# Patient Record
Sex: Male | Born: 2000 | Race: White | Hispanic: No | Marital: Single
Health system: Southern US, Community
[De-identification: ages and names within clinical notes are randomized; demographics above are authoritative.]

## PROBLEM LIST (undated history)

## (undated) HISTORY — PX: LEG SURGERY: SHX1003

---
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
## 2019-05-11 ENCOUNTER — Other Ambulatory Visit: Payer: Self-pay | Admitting: *Deleted

## 2019-05-11 DIAGNOSIS — Z20822 Contact with and (suspected) exposure to covid-19: Secondary | ICD-10-CM

## 2019-05-13 LAB — NOVEL CORONAVIRUS, NAA: SARS-CoV-2, NAA: NOT DETECTED

## 2019-05-16 ENCOUNTER — Telehealth: Payer: Self-pay | Admitting: General Practice

## 2019-05-16 NOTE — Telephone Encounter (Signed)
Pt called in for covid result  Advised per Not Detected result.

## 2020-01-17 ENCOUNTER — Ambulatory Visit
Admission: EM | Admit: 2020-01-17 | Discharge: 2020-01-17 | Disposition: A | Discharge status: Home / Self Care | Payer: PRIVATE HEALTH INSURANCE | Attending: Family Medicine | Admitting: Family Medicine

## 2020-01-17 ENCOUNTER — Other Ambulatory Visit: Payer: Self-pay

## 2020-01-17 DIAGNOSIS — J209 Acute bronchitis, unspecified: Secondary | ICD-10-CM

## 2020-01-17 DIAGNOSIS — Z20822 Contact with and (suspected) exposure to covid-19: Secondary | ICD-10-CM

## 2020-01-17 DIAGNOSIS — J3089 Other allergic rhinitis: Secondary | ICD-10-CM

## 2020-01-17 DIAGNOSIS — Z20828 Contact with and (suspected) exposure to other viral communicable diseases: Secondary | ICD-10-CM

## 2020-01-17 DIAGNOSIS — Z1152 Encounter for screening for COVID-19: Secondary | ICD-10-CM

## 2020-01-17 DIAGNOSIS — R059 Cough, unspecified: Secondary | ICD-10-CM

## 2020-01-17 MED ORDER — ALBUTEROL SULFATE HFA 108 (90 BASE) MCG/ACT IN AERS: 2.0000 | INHALATION_SPRAY | RESPIRATORY_TRACT | 0 refills | Status: AC | PRN

## 2020-01-17 MED ORDER — PREDNISONE 10 MG (21) PO TBPK: ORAL_TABLET | Freq: Every day | ORAL | 0 refills | Status: AC

## 2020-01-17 MED ORDER — LEVOCETIRIZINE DIHYDROCHLORIDE 5 MG PO TABS: 5.0000 mg | ORAL_TABLET | Freq: Every evening | ORAL | 0 refills | Status: AC

## 2020-01-17 NOTE — Discharge Instructions (Addendum)
I have sent in an albuterol inhaler for you to use   I have sent in prednisone for you to take as well  I have also sent in xyzal for you to take for allergies  Follow up with this office or with primary care if you develop fever, discolored phlegm, or other concerning symptoms

## 2020-01-17 NOTE — ED Provider Notes (Signed)
[facility]   [id number] 01/17/20 Arrival Time: 1611   SUBJECTIVE:  Philip Alexander is a 19 y.o. male who presents with complaint of nasal congestion, post-nasal drainage, and a persistent cough. Onset abrupt approximately 3 days ago. Overall fatigued. SOB: mild. Wheezing: mild. OTC treatment: OTC cough suppresant. Social History   Tobacco Use  Smoking Status Passive Smoke Exposure - Never Smoker  Smokeless Tobacco Current User  . Types: Chew    ROS: As per HPI.   OBJECTIVE:  Vitals:   01/17/20 1625  BP: 123/82  Pulse: 78  Resp: 17  Temp: 98.2 F (36.8 C)  TempSrc: Oral  SpO2: 97%     General appearance: alert; no distress HEENT: nasal congestion; clear runny nose; throat irritation secondary to post-nasal drainage Neck: supple without LAD Lungs: wheezing noted in bilateral lower lobes Skin: warm and dry Psychological: alert and cooperative; normal mood and affect  Results for orders placed or performed during the hospital encounter of 01/17/20  Novel Coronavirus, NAA ([facility])   Specimen: Nasopharyngeal Swab; Nasopharyngeal(NP) swabs in vial transport medium   Nasopharynge  Result Value Ref Range   SARS-CoV-2, NAA Not Detected Not Detected  SARS-COV-2, NAA 2 DAY TAT   Nasopharynge  Result Value Ref Range   SARS-CoV-2, NAA 2 DAY TAT Performed     Labs Reviewed  NOVEL CORONAVIRUS, NAA   Narrative:    Performed at:  [address] [employer] [address], [place], [place]  [id number] Lab Director: Jolene Schimke MD, Phone:  [phone]  SARS-COV-2, NAA 2 DAY TAT   Narrative:    Performed at:  [address] [place] [address] [place], [place]  [id number] Lab Director: Jolene Schimke MD, Phone:  [phone]    No results found.  Allergies  Allergen Reactions  . Codeine     hives    No past medical history on file. Social History   Socioeconomic History  . Marital status: Single    Spouse name: Not on file  . Number of children:  Not on file  . Years of education: Not on file  . Highest education level: Not on file  Occupational History  . Not on file  Tobacco Use  . Smoking status: Passive Smoke Exposure - Never Smoker  . Smokeless tobacco: Current User    Types: Chew  Substance and Sexual Activity  . Alcohol use: No  . Drug use: No  . Sexual activity: Not on file  Other Topics Concern  . Not on file  Social History Narrative  . Not on file   Social Determinants of Health   Financial Resource Strain:   . Difficulty of Paying Living Expenses:   Food Insecurity:   . Worried About Programme researcher, broadcasting/film/video in the Last Year:   . Barista in the Last Year:   Transportation Needs:   . Freight forwarder (Medical):   Marland Kitchen Lack of Transportation (Non-Medical):   Physical Activity:   . Days of Exercise per Week:   . Minutes of Exercise per Session:   Stress:   . Feeling of Stress :   Social Connections:   . Frequency of Communication with Friends and Family:   . Frequency of Social Gatherings with Friends and Family:   . Attends Religious Services:   . Active Member of Clubs or Organizations:   . Attends Banker Meetings:   Marland Kitchen Marital Status:   Intimate Partner Violence:   . Fear of Current or  Ex-Partner:   . Emotionally Abused:   Marland Kitchen Physically Abused:   . Sexually Abused:    No family history on file.   ASSESSMENT & PLAN:  1. Acute bronchitis, unspecified organism   2. Encounter for preoperative screening laboratory testing for COVID-19 virus   3. Encounter for screening for COVID-19   4. Cough   5. Allergic rhinitis due to other allergic trigger, unspecified seasonality     Meds ordered this encounter  Medications  . predniSONE (STERAPRED UNI-PAK 21 TAB) 10 MG (21) TBPK tablet    Sig: Take by mouth daily for 6 days. Take 6 tablets on day 1, 5 tablets on day 2, 4 tablets on day 3, 3 tablets on day 4, 2 tablets on day 5, 1 tablet on day 6    Dispense:  21 tablet    Refill:   0    Order Specific Question:   Supervising Provider    Answer:   Chase Picket [id number]  . albuterol (VENTOLIN HFA) 108 (90 Base) MCG/ACT inhaler    Sig: Inhale 2 puffs into the lungs every 4 (four) hours as needed for wheezing or shortness of breath.    Dispense:  18 g    Refill:  0    Order Specific Question:   Supervising Provider    Answer:   Chase Picket [id number]  . levocetirizine (XYZAL) 5 MG tablet    Sig: Take 1 tablet (5 mg total) by mouth every evening.    Dispense:  30 tablet    Refill:  0    Order Specific Question:   Supervising Provider    Answer:   Chase Picket [[id number]]    Acute Bronchitis Allergic Rhinitis Covid Screen  Prescribed Xyzal Prescribed Prednisone taper Prescribed albuterol inhaler  OTC symptom care as needed. Will plan f/u with PCP or here as needed.  Reviewed expectations re: course of current medical issues. Questions answered. Outlined signs and symptoms indicating need for more acute intervention. Patient verbalized understanding. After Visit Summary given.            Faustino Congress, NP 01/18/20 [phone]

## 2020-01-17 NOTE — ED Triage Notes (Signed)
Pt presents with c/o cough since yesterday

## 2020-01-18 LAB — SARS-COV-2, NAA 2 DAY TAT

## 2020-01-18 LAB — NOVEL CORONAVIRUS, NAA: SARS-CoV-2, NAA: NOT DETECTED

## 2020-02-22 ENCOUNTER — Emergency Department (HOSPITAL_COMMUNITY)
Admission: EM | Admit: 2020-02-22 | Discharge: 2020-02-22 | Disposition: A | Discharge status: Home / Self Care | Payer: PRIVATE HEALTH INSURANCE | Attending: Emergency Medicine | Admitting: Emergency Medicine

## 2020-02-22 ENCOUNTER — Encounter: Payer: Self-pay | Admitting: Emergency Medicine

## 2020-02-22 ENCOUNTER — Ambulatory Visit (INDEPENDENT_AMBULATORY_CARE_PROVIDER_SITE_OTHER)
Admission: EM | Admit: 2020-02-22 | Discharge: 2020-02-22 | Disposition: A | Discharge status: Home / Self Care | Payer: PRIVATE HEALTH INSURANCE | Source: Home / Self Care

## 2020-02-22 ENCOUNTER — Emergency Department (HOSPITAL_COMMUNITY): Payer: PRIVATE HEALTH INSURANCE

## 2020-02-22 DIAGNOSIS — Z7722 Contact with and (suspected) exposure to environmental tobacco smoke (acute) (chronic): Secondary | ICD-10-CM | POA: Diagnosis not present

## 2020-02-22 DIAGNOSIS — R509 Fever, unspecified: Secondary | ICD-10-CM | POA: Diagnosis present

## 2020-02-22 DIAGNOSIS — B349 Viral infection, unspecified: Secondary | ICD-10-CM | POA: Insufficient documentation

## 2020-02-22 DIAGNOSIS — R000 Tachycardia, unspecified: Secondary | ICD-10-CM | POA: Insufficient documentation

## 2020-02-22 DIAGNOSIS — Z20822 Contact with and (suspected) exposure to covid-19: Secondary | ICD-10-CM | POA: Insufficient documentation

## 2020-02-22 DIAGNOSIS — R42 Dizziness and giddiness: Secondary | ICD-10-CM | POA: Diagnosis not present

## 2020-02-22 DIAGNOSIS — R531 Weakness: Secondary | ICD-10-CM

## 2020-02-22 LAB — CBC WITH DIFFERENTIAL/PLATELET
Abs Immature Granulocytes: 0.02 10*3/uL (ref 0.00–0.07)
Basophils Absolute: 0 10*3/uL (ref 0.0–0.1)
Basophils Relative: 0 %
Eosinophils Absolute: 0 10*3/uL (ref 0.0–0.5)
Eosinophils Relative: 0 %
HCT: 43.8 % (ref 39.0–52.0)
Hemoglobin: 15.4 g/dL (ref 13.0–17.0)
Immature Granulocytes: 0 %
Lymphocytes Relative: 7 %
Lymphs Abs: 0.6 10*3/uL — ABNORMAL LOW (ref 0.7–4.0)
MCH: 32.4 pg (ref 26.0–34.0)
MCHC: 35.2 g/dL (ref 30.0–36.0)
MCV: 92 fL (ref 80.0–100.0)
Monocytes Absolute: 0.6 10*3/uL (ref 0.1–1.0)
Monocytes Relative: 7 %
Neutro Abs: 7.4 10*3/uL (ref 1.7–7.7)
Neutrophils Relative %: 86 %
Platelets: 135 10*3/uL — ABNORMAL LOW (ref 150–400)
RBC: 4.76 MIL/uL (ref 4.22–5.81)
RDW: 12.2 % (ref 11.5–15.5)
WBC: 8.7 10*3/uL (ref 4.0–10.5)
nRBC: 0 % (ref 0.0–0.2)

## 2020-02-22 LAB — COMPREHENSIVE METABOLIC PANEL
ALT: 20 U/L (ref 0–44)
AST: 19 U/L (ref 15–41)
Albumin: 4.8 g/dL (ref 3.5–5.0)
Alkaline Phosphatase: 58 U/L (ref 38–126)
Anion gap: 10 (ref 5–15)
BUN: 11 mg/dL (ref 6–20)
CO2: 25 mmol/L (ref 22–32)
Calcium: 9 mg/dL (ref 8.9–10.3)
Chloride: 101 mmol/L (ref 98–111)
Creatinine, Ser: 0.86 mg/dL (ref 0.61–1.24)
GFR calc Af Amer: >60 mL/min (ref >60)
GFR calc non Af Amer: >60 mL/min (ref >60)
Glucose, Bld: 96 mg/dL (ref 70–99)
Potassium: 3.3 mmol/L — ABNORMAL LOW (ref 3.5–5.1)
Sodium: 136 mmol/L (ref 135–145)
Total Bilirubin: 1 mg/dL (ref 0.3–1.2)
Total Protein: 7.4 g/dL (ref 6.5–8.1)

## 2020-02-22 LAB — POC SARS CORONAVIRUS 2 AG -  ED: SARS Coronavirus 2 Ag: NEGATIVE

## 2020-02-22 LAB — POCT FASTING CBG KUC MANUAL ENTRY: POCT Glucose (KUC): 103 mg/dL — AB (ref 70–99)

## 2020-02-22 LAB — GROUP A STREP BY PCR: Group A Strep by PCR: NOT DETECTED

## 2020-02-22 LAB — SARS CORONAVIRUS 2 BY RT PCR (HOSPITAL ORDER, PERFORMED IN ~~LOC~~ HOSPITAL LAB): SARS Coronavirus 2: NEGATIVE

## 2020-02-22 LAB — CBG MONITORING, ED: Glucose-Capillary: 93 mg/dL (ref 70–99)

## 2020-02-22 MED ORDER — ONDANSETRON HCL 4 MG/2ML IJ SOLN
4.0000 mg | Freq: Once | INTRAMUSCULAR | Status: AC
  Administered 2020-02-22: 4 mg via INTRAVENOUS
  Filled 2020-02-22: qty 2

## 2020-02-22 MED ORDER — SODIUM CHLORIDE 0.9 % IV BOLUS
1000.0000 mL | Freq: Once | INTRAVENOUS | Status: AC
  Administered 2020-02-22: 1000 mL via INTRAVENOUS

## 2020-02-22 MED ORDER — ACETAMINOPHEN 325 MG PO TABS
650.0000 mg | ORAL_TABLET | Freq: Once | ORAL | Status: AC
  Administered 2020-02-22: 650 mg via ORAL
  Filled 2020-02-22: qty 2

## 2020-02-22 MED ORDER — ONDANSETRON 4 MG PO TBDP
4.0000 mg | ORAL_TABLET | Freq: Three times a day (TID) | ORAL | 0 refills | Status: DC | PRN
Start: 2020-02-22 — End: 2020-03-26

## 2020-02-22 NOTE — ED Provider Notes (Signed)
[facility] EMERGENCY DEPARTMENT Provider Note   CSN: [id number] Arrival date & time: 02/22/20  1501     History Chief Complaint  Patient presents with  . Heat Exposure    Philip Alexander is a 19 y.o. male.  HPI   This patient is an otherwise healthy 19 year old male who reports that for the last 2 or 3 days he has had some upper respiratory symptoms including sore throat, coughing, subjective fevers and chills, went to the urgent care today and had a temperature of 103.  He arrives to the hospital with a complaint of ongoing dizziness and lightheadedness which actually started last night.  He has not had any nausea vomiting or diarrhea.  He has not lost sense of taste or smell.  He reports having a cough for the last month, was told it was bronchitis, no other known sick exposures.  Symptoms are persistent, gradually worsening, and not associated with any rashes to the skin.  He has not been vaccinated for Covid.  No past medical history on file.  There are no problems to display for this patient.   Past Surgical History:  Procedure Laterality Date  . LEG SURGERY Left        No family history on file.  Social History   Tobacco Use  . Smoking status: Passive Smoke Exposure - Never Smoker  . Smokeless tobacco: Current User    Types: Chew  Substance Use Topics  . Alcohol use: No  . Drug use: No    Home Medications Prior to Admission medications   Medication Sig Start Date End Date Taking? Authorizing Provider  albuterol (VENTOLIN HFA) 108 (90 Base) MCG/ACT inhaler Inhale 2 puffs into the lungs every 4 (four) hours as needed for wheezing or shortness of breath. 01/17/20  Yes Moshe Cipro, NP  dextromethorphan-guaiFENesin [facility] DM) 30-600 MG 12hr tablet Take 1 tablet by mouth 2 (two) times daily.   Yes [provider]  levocetirizine (XYZAL) 5 MG tablet Take 1 tablet (5 mg total) by mouth every evening. Patient not taking: Reported on 02/22/2020 01/17/20    Moshe Cipro, NP  ondansetron (ZOFRAN ODT) 4 MG disintegrating tablet Take 1 tablet (4 mg total) by mouth every 8 (eight) hours as needed for nausea. 02/22/20   Eber Hong, MD    Allergies    Codeine  Review of Systems   Review of Systems  All other systems reviewed and are negative.   Physical Exam Updated Vital Signs BP (!) 113/93   Pulse 98   Temp (!) 101.1 F (38.4 C) (Oral)   Resp (!) 33   SpO2 98%   Physical Exam Vitals and nursing note reviewed.  Constitutional:      General: He is not in acute distress.    Appearance: He is well-developed.  HENT:     Head: Normocephalic and atraumatic.     Mouth/Throat:     Pharynx: Posterior oropharyngeal erythema present. No oropharyngeal exudate.     Comments: Erythematous pharynx without asymmetry, exudate or changes in phonation Eyes:     General: No scleral icterus.       Right eye: No discharge.        Left eye: No discharge.     Conjunctiva/sclera: Conjunctivae normal.     Pupils: Pupils are equal, round, and reactive to light.  Neck:     Thyroid: No thyromegaly.     Vascular: No JVD.  Cardiovascular:     Rate and Rhythm: Regular rhythm. Tachycardia  present.     Heart sounds: Normal heart sounds. No murmur heard.  No friction rub. No gallop.      Comments: Mild tachycardia, normal pulses at the radial arteries Pulmonary:     Effort: Pulmonary effort is normal. No respiratory distress.     Breath sounds: Normal breath sounds. No wheezing or rales.     Comments: Speaks in full sentences without any distress, no increased work of breathing, no abnormal lung sounds Abdominal:     General: Bowel sounds are normal. There is no distension.     Palpations: Abdomen is soft. There is no mass.     Tenderness: There is no abdominal tenderness.  Musculoskeletal:        General: No tenderness. Normal range of motion.     Cervical back: Normal range of motion and neck supple.  Lymphadenopathy:     Cervical: No  cervical adenopathy.  Skin:    General: Skin is warm and dry.     Findings: No erythema or rash.  Neurological:     Mental Status: He is alert.     Coordination: Coordination normal.     Comments: Awake alert and following commands, memory is intact  Psychiatric:        Behavior: Behavior normal.     ED Results / Procedures / Treatments   Labs (all labs ordered are listed, but only abnormal results are displayed) Labs Reviewed  CBC WITH DIFFERENTIAL/PLATELET - Abnormal; Notable for the following components:      Result Value   Platelets 135 (*)    Lymphs Abs 0.6 (*)    All other components within normal limits  COMPREHENSIVE METABOLIC PANEL - Abnormal; Notable for the following components:   Potassium 3.3 (*)    All other components within normal limits  GROUP A STREP BY PCR  SARS CORONAVIRUS 2 BY RT PCR (HOSPITAL ORDER, PERFORMED IN [place] [facility])  POC SARS CORONAVIRUS 2 AG -  ED  CBG MONITORING, ED    EKG None  Radiology DG Chest Port 1 View  Result Date: 02/22/2020 CLINICAL DATA:  Cough, concern for COVID-19 EXAM: PORTABLE CHEST 1 VIEW COMPARISON:  Radiograph 10/07/2015 FINDINGS: No consolidation, features of edema, pneumothorax, or effusion. Pulmonary vascularity is normally distributed. The cardiomediastinal contours are unremarkable. No acute osseous or soft tissue abnormality. Telemetry leads overlie the chest. IMPRESSION: No acute cardiopulmonary abnormality. Electronically Signed   By: Kreg Shropshire M.D.   On: 02/22/2020 16:48    Procedures Procedures (including critical care time)  Medications Ordered in ED Medications  sodium chloride 0.9 % bolus 1,000 mL (1,000 mLs Intravenous New Bag/Given 02/22/20 1516)  ondansetron (ZOFRAN) injection 4 mg (4 mg Intravenous Given 02/22/20 1558)  acetaminophen (TYLENOL) tablet 650 mg (650 mg Oral Given 02/22/20 1557)    ED Course  I have reviewed the triage vital signs and the nursing notes.  Pertinent labs &  imaging results that were available during my care of the patient were reviewed by me and considered in my medical decision making (see chart for details).  Clinical Course as of Feb 21 1745  Fri Feb 22, 2020  1650 I have personally viewed and interpreted the chest x-ray, this is an anterior posterior x-ray showing no signs of pulmonary infiltrates pneumothorax or abnormal mediastinum.  Lungs are clear, heart borders are normal, no cardiomegaly, no subdiaphragmatic air, skin and soft tissues appear unremarkable.  Normal x-ray   [BM]    Clinical Course User Index [BM]  Eber Hong, MD   MDM Rules/Calculators/A&P                          The patient has registered a fever prehospital, he appears to be mildly ill with some type of viral process, sore throat, occasional cough, headache, lightheadedness and dizziness and feeling like he is weak in the bilateral legs all suggestive of a respiratory viral process.  He has not been vaccinated for Covid.  Will check for Covid, he will get IV fluids, antiemetics as needed, fever control as needed.  The patient has had 2 - Covid test including the PCR, has had normal lab work including a white blood cell count which is 8700 and a normal hemoglobin.  The metabolic panel shows a potassium of 3.3, normal kidney function, negative strep test.  This patient appears well, he is not tachypneic on my exam, his fever has improved and is defervesced after Tylenol, his blood pressure is normal at 113/93.  The patient was updated on his findings, lab work and treatment plan including follow-up and is in total agreement.  He will be given Zofran for home and encouraged to stay to work for 2 days and drink plenty of fluids.  He understands that he may be contagious to others.  He does not have a stiff neck and has totally normal mental status  Philip Alexander was evaluated in Emergency Department on 02/22/2020 for the symptoms described in the history of present illness.  He was evaluated in the context of the global COVID-19 pandemic, which necessitated consideration that the patient might be at risk for infection with the SARS-CoV-2 virus that causes COVID-19. Institutional protocols and algorithms that pertain to the evaluation of patients at risk for COVID-19 are in a state of rapid change based on information released by regulatory bodies including the CDC and federal and state organizations. These policies and algorithms were followed during the patient's care in the ED.   Final Clinical Impression(s) / ED Diagnoses Final diagnoses:  Viral syndrome    Rx / DC Orders ED Discharge Orders         Ordered    ondansetron (ZOFRAN ODT) 4 MG disintegrating tablet  Every 8 hours PRN     Discontinue  Reprint     02/22/20 1744           Eber Hong, MD 02/22/20 1746

## 2020-02-22 NOTE — ED Triage Notes (Signed)
Pt came from [facility]. He was working outside today and began feeling bad this morning. Once he was working outside today, he felt worse. Tachycardic with EMS up to 110. Other VSS. 20 R AC with of fluids in.

## 2020-02-22 NOTE — Discharge Instructions (Addendum)
Recommending further evaluation and management in the ED for possible heat exhaustion vs. stroke.  Fever (103.1) and tachycardic (118) in office.  Blood pressure and SpO2 stable.  Water given in office.  IV and fluids started.  Cold compress applied to forehead.  Ice pack to neck.  EMS called and transported to [facility] ED in stable condition.

## 2020-02-22 NOTE — ED Triage Notes (Addendum)
Pt woke up this morning feeling weak, has been working outside with long sleeves on all day.  Pt states he has been dizzy, seeing spots, having cold chills.  Also reports cough and sore throat that has been going on a while.  No covid exposures that he is aware of.

## 2020-02-22 NOTE — ED Notes (Signed)
Patient is being discharged from the Urgent Care and sent to the Emergency Department via ambulance . Per B. Wurst, patient is in need of higher level of care due to tachycardia/ fever. Patient is aware and verbalizes understanding of plan of care.  Vitals:   02/22/20 1437  BP: (!) 129/61  Pulse: (!) 112  Resp: 19  Temp: (!) 103.1 F (39.5 C)  SpO2: 96%

## 2020-02-22 NOTE — ED Provider Notes (Addendum)
  [facility]ARE [facility]ENTER   [id number] 02/22/20 Arrival Time: 1418  Abbreviated note: KG:[id number]  SUBJECTIVE:  Philip Alexander is a 19 y.o. male who presents with complaint of dizziness, feeling weak, and seeing "spots in his vision" that began this morning while working out side.  Also mentions runny nose, congestion, sore throat, and cough that has been ongoing.  Deneis alleviating factors.  Symptoms made worse with walking.  Denies chest pain, SOB, abdominal pain, nausea, vomiting.    ROS: As per HPI.  All other pertinent ROS negative.     OBJECTIVE:  Vitals:   02/22/20 1436 02/22/20 1437  BP:  (!) 129/61  Pulse:  (!) 112  Resp:  19  Temp:  (!) 103.1 F (39.5 C)  TempSrc:  Oral  SpO2:  96%  Weight: 158 lb 11.7 oz (72 kg)   Height: 5\' 10"  (1.778 m)     General appearance: alert; appears uncomfortable, but nontoxic Eyes: PERRLA; EOMI; conjunctiva normal HENT: normocephalic; atraumatic; nares patent; oropharynx clear Neck: supple with FROM Lungs: clear to auscultation bilaterally Heart: regular rate and rhythm Skin: warm and dry Neurologic: normal gait; CN 2-12 grossly intact; no facial droop, slurred speech, moves limbs without obvious deficit Psychological: alert and cooperative; mildly anxious mood and affect  LABS:  Results for orders placed or performed during the hospital encounter of 02/22/20 (from the past 24 hour(s))  POCT CBG (manual entry)     Status: Abnormal   Collection Time: 02/22/20  2:42 PM  Result Value Ref Range   POCT Glucose (KUC) 103 (A) 70 - 99 mg/dL     ASSESSMENT & PLAN:  1. Dizzy   2. Weakness   3. Fever, unspecified   4. Tachycardia, unspecified     Meds ordered this encounter  Medications  . sodium chloride 0.9 % bolus 1,000 mL    Recommending further evaluation and management in the ED for possible heat exhaustion vs. stroke.  Fever (103.1) and tachycardic (118) in office.  Blood pressure and SpO2 stable.  Water given in office.  IV  and fluids started.  Cold compress applied to forehead.  Ice pack to neck.  EMS called and transported to [facility] ED in stable condition.      [facility] PA-C 02/22/20 1608    02/24/20 [place], PA-C 02/22/20 1610

## 2020-02-22 NOTE — Discharge Instructions (Signed)
There is no signs of acute abnormalities on your test, you have tested negative for Covid twice, strep test was negative, you probably do have a viral infection which may give you coughing shortness of breath sore throat or stuffy nose and body aches.  This is typical for viral infections.  Please stay home, drink plenty of clear liquids, if you are nauseated take Zofran every 8 hours as needed.  Please seek medical exam in the emergency department if you should develop severe or worsening symptoms

## 2020-02-25 ENCOUNTER — Encounter: Payer: Self-pay | Admitting: Emergency Medicine

## 2020-02-25 ENCOUNTER — Other Ambulatory Visit: Payer: Self-pay

## 2020-02-25 ENCOUNTER — Ambulatory Visit
Admission: EM | Admit: 2020-02-25 | Discharge: 2020-02-25 | Disposition: A | Discharge status: Home / Self Care | Payer: PRIVATE HEALTH INSURANCE | Attending: Family Medicine | Admitting: Family Medicine

## 2020-02-25 DIAGNOSIS — B341 Enterovirus infection, unspecified: Secondary | ICD-10-CM

## 2020-02-25 DIAGNOSIS — R21 Rash and other nonspecific skin eruption: Secondary | ICD-10-CM

## 2020-02-25 MED ORDER — TRIAMCINOLONE ACETONIDE 0.1 % EX CREA: 1.0000 "application " | TOPICAL_CREAM | Freq: Two times a day (BID) | CUTANEOUS | 0 refills | Status: AC

## 2020-02-25 NOTE — Discharge Instructions (Addendum)
You have hand, foot and mouth disease  This is a virus that will resolve within 2 weeks at the most  This is contagious  Follow up with this office or with primary care if you are not feeling better within the week

## 2020-02-25 NOTE — ED Triage Notes (Signed)
Red itchy rash on hands and feet since yesterday.  Pt has been in contact with 2 nephews that have recently been dx with hand foot and mouth disease.

## 2020-02-25 NOTE — ED Provider Notes (Addendum)
[facility]ARE [facility]ENTER   [id number] 02/25/20 Arrival Time: 1030  CC: RASH  SUBJECTIVE:  Philip Alexander is a 19 y.o. male who presents with a skin complaint that began yesterday. Was seen in this office with dehydration last week. Reports that his nieces and nephews have hand, foot, and mouth. Reports that the rash appears to be almost like blisters and is itchy. Located to palms of hands and soles of feet. Denies medications change or starting a new medication recently. Has not taken OTC medications for this. Denies fever, chills, nausea, vomiting, erythema, swelling, discharge, oral lesions, SOB, chest pain, abdominal pain, changes in bowel or bladder function.    ROS: As per HPI.  All other pertinent ROS negative.     History reviewed. No pertinent past medical history. Past Surgical History:  Procedure Laterality Date   LEG SURGERY Left    Allergies  Allergen Reactions   Codeine Hives   No current facility-administered medications on file prior to encounter.   Current Outpatient Medications on File Prior to Encounter  Medication Sig Dispense Refill   albuterol (VENTOLIN HFA) 108 (90 Base) MCG/ACT inhaler Inhale 2 puffs into the lungs every 4 (four) hours as needed for wheezing or shortness of breath. 18 g 0   dextromethorphan-guaiFENesin (MUCINEX DM) 30-600 MG 12hr tablet Take 1 tablet by mouth 2 (two) times daily.     levocetirizine (XYZAL) 5 MG tablet Take 1 tablet (5 mg total) by mouth every evening. (Patient not taking: Reported on 02/22/2020) 30 tablet 0   ondansetron (ZOFRAN ODT) 4 MG disintegrating tablet Take 1 tablet (4 mg total) by mouth every 8 (eight) hours as needed for nausea. 10 tablet 0   Social History   Socioeconomic History   Marital status: Single    Spouse name: Not on file   Number of children: Not on file   Years of education: Not on file   Highest education level: Not on file  Occupational History   Not on file  Tobacco Use   Smoking  status: Passive Smoke Exposure - Never Smoker   Smokeless tobacco: Current User    Types: Chew  Substance and Sexual Activity   Alcohol use: No   Drug use: No   Sexual activity: Not on file  Other Topics Concern   Not on file  Social History Narrative   Not on file   Social Determinants of Health   Financial Resource Strain:    Difficulty of Paying Living Expenses:   Food Insecurity:    Worried About Running Out of Food in the Last Year:    Barista in the Last Year:   Transportation Needs:    Freight forwarder (Medical):    Lack of Transportation (Non-Medical):   Physical Activity:    Days of Exercise per Week:    Minutes of Exercise per Session:   Stress:    Feeling of Stress :   Social Connections:    Frequency of Communication with Friends and Family:    Frequency of Social Gatherings with Friends and Family:    Attends Religious Services:    Active Member of Clubs or Organizations:    Attends Engineer, structural:    Marital Status:   Intimate Partner Violence:    Fear of Current or Ex-Partner:    Emotionally Abused:    Physically Abused:    Sexually Abused:    No family history on file.  OBJECTIVE: Vitals:   02/25/20 1053  BP: (!) 101/60  Pulse: 79  Resp: 17  Temp: 98.4 F (36.9 C)  TempSrc: Oral  SpO2: 97%    General appearance: alert; no distress Head: NCAT Lungs: clear to auscultation bilaterally Heart: regular rate and rhythm.  Radial pulse 2+ bilaterally Extremities: no edema Skin: warm and dry; erythematous, vesicular rash to palms of hands and soles of feet Psychological: alert and cooperative; normal mood and affect  ASSESSMENT & PLAN:  1. Rash and nonspecific skin eruption   2. Coxsackieviruses     Meds ordered this encounter  Medications   triamcinolone cream (KENALOG) 0.1 %    Sig: Apply 1 application topically 2 (two) times daily.    Dispense:  30 g    Refill:  0    Order Specific  Question:   Supervising Provider    Answer:   Merrilee Jansky [[id number]]    Rash consistent with coxsackie's disease Triamcinolone 0.1% (corticosteroid - itch/ inflammation relief) Avoid hot showers/ baths Moisturize skin daily  Follow up with PCP if symptoms persists Return or go to the ER if you have any new or worsening symptoms such as fever, chills, nausea, vomiting, redness, swelling, discharge, if symptoms do not improve with medications  Reviewed expectations re: course of current medical issues. Questions answered. Outlined signs and symptoms indicating need for more acute intervention. Patient verbalized understanding. After Visit Summary given.   Moshe Cipro, NP 02/29/20 0623    Moshe Cipro, NP 02/29/20 [phone]

## 2020-03-26 ENCOUNTER — Ambulatory Visit
Admission: EM | Admit: 2020-03-26 | Discharge: 2020-03-26 | Disposition: A | Discharge status: Home / Self Care | Payer: PRIVATE HEALTH INSURANCE | Attending: Emergency Medicine | Admitting: Emergency Medicine

## 2020-03-26 ENCOUNTER — Other Ambulatory Visit: Payer: Self-pay

## 2020-03-26 DIAGNOSIS — Z20822 Contact with and (suspected) exposure to covid-19: Secondary | ICD-10-CM

## 2020-03-26 DIAGNOSIS — J069 Acute upper respiratory infection, unspecified: Secondary | ICD-10-CM

## 2020-03-26 MED ORDER — BENZONATATE 100 MG PO CAPS: 100.0000 mg | ORAL_CAPSULE | Freq: Three times a day (TID) | ORAL | 0 refills | Status: AC

## 2020-03-26 NOTE — Discharge Instructions (Signed)

## 2020-03-26 NOTE — ED Provider Notes (Signed)
[facility] CARE CENTER   [id number] 03/26/20 Arrival Time: 1712   CC: COVID symptoms  SUBJECTIVE: History from: patient.  Philip Alexander is a 19 y.o. male who presents with headache, nasal congestion, and mild productive cough x 1 day.  Sister with similar symptoms.  Has tried [facility] powder with relief.  Denies aggravating factors.  Denies previous COVID infection in the past.   Denies fever, chills, sore throat, SOB, wheezing, chest pain, nausea, changes in bowel or bladder habits.     ROS: As per HPI.  All other pertinent ROS negative.     History reviewed. No pertinent past medical history. Past Surgical History:  Procedure Laterality Date  . LEG SURGERY Left    Allergies  Allergen Reactions  . Codeine Hives   No current facility-administered medications on file prior to encounter.   Current Outpatient Medications on File Prior to Encounter  Medication Sig Dispense Refill  . albuterol (VENTOLIN HFA) 108 (90 Base) MCG/ACT inhaler Inhale 2 puffs into the lungs every 4 (four) hours as needed for wheezing or shortness of breath. 18 g 0  . dextromethorphan-guaiFENesin (MUCINEX DM) 30-600 MG 12hr tablet Take 1 tablet by mouth 2 (two) times daily.    Marland Kitchen levocetirizine (XYZAL) 5 MG tablet Take 1 tablet (5 mg total) by mouth every evening. (Patient not taking: Reported on 02/22/2020) 30 tablet 0  . triamcinolone cream (KENALOG) 0.1 % Apply 1 application topically 2 (two) times daily. 30 g 0   Social History   Socioeconomic History  . Marital status: Single    Spouse name: Not on file  . Number of children: Not on file  . Years of education: Not on file  . Highest education level: Not on file  Occupational History  . Not on file  Tobacco Use  . Smoking status: Passive Smoke Exposure - Never Smoker  . Smokeless tobacco: Current User    Types: Chew  Substance and Sexual Activity  . Alcohol use: No  . Drug use: No  . Sexual activity: Not on file  Other Topics Concern  . Not on file   Social History Narrative  . Not on file   Social Determinants of Health   Financial Resource Strain:   . Difficulty of Paying Living Expenses: Not on file  Food Insecurity:   . Worried About Programme researcher, broadcasting/film/video in the Last Year: Not on file  . Ran Out of Food in the Last Year: Not on file  Transportation Needs:   . Lack of Transportation (Medical): Not on file  . Lack of Transportation (Non-Medical): Not on file  Physical Activity:   . Days of Exercise per Week: Not on file  . Minutes of Exercise per Session: Not on file  Stress:   . Feeling of Stress : Not on file  Social Connections:   . Frequency of Communication with Friends and Family: Not on file  . Frequency of Social Gatherings with Friends and Family: Not on file  . Attends Religious Services: Not on file  . Active Member of Clubs or Organizations: Not on file  . Attends Banker Meetings: Not on file  . Marital Status: Not on file  Intimate Partner Violence:   . Fear of Current or Ex-Partner: Not on file  . Emotionally Abused: Not on file  . Physically Abused: Not on file  . Sexually Abused: Not on file   History reviewed. No pertinent family history.  OBJECTIVE:  Vitals:   03/26/20 1729  BP: 110/60  Pulse: 85  Resp: 18  Temp: 98.3 F (36.8 C)  SpO2: 98%     General appearance: alert; well-appearing, nontoxic; speaking in full sentences and tolerating own secretions HEENT: NCAT; Ears: EACs clear, TMs pearly gray; Eyes: PERRL.  EOM grossly intact.Nose: nares patent without rhinorrhea, Throat: oropharynx clear, tonsils non erythematous or enlarged, uvula midline  Neck: supple without LAD Lungs: unlabored respirations, symmetrical air entry; cough: mild; no respiratory distress; CTAB Heart: regular rate and rhythm.  Skin: warm and dry Psychological: alert and cooperative; normal mood and affect   ASSESSMENT & PLAN:  1. Viral URI with cough   2. Suspected COVID-19 virus infection     Meds  ordered this encounter  Medications  . benzonatate (TESSALON) 100 MG capsule    Sig: Take 1 capsule (100 mg total) by mouth every 8 (eight) hours.    Dispense:  21 capsule    Refill:  0    Order Specific Question:   Supervising Provider    Answer:   Eustace Moore [[id number]]   COVID testing ordered.  It will take between 2-5 days for test results.  Someone will contact you regarding abnormal results.    In the meantime: You should remain isolated in your home for 10 days from symptom onset AND greater than 72 hours after symptoms resolution (absence of fever without the use of fever-reducing medication and improvement in respiratory symptoms), whichever is longer Get plenty of rest and push fluids Tessalon Perles prescribed for cough Use OTC zyrtec for nasal congestion, runny nose, and/or sore throat Use OTC flonase for nasal congestion and runny nose Use medications daily for symptom relief Use OTC medications like ibuprofen or tylenol as needed fever or pain Call or go to the ED if you have any new or worsening symptoms such as fever, worsening cough, shortness of breath, chest tightness, chest pain, turning blue, changes in mental status, etc...   Reviewed expectations re: course of current medical issues. Questions answered. Outlined signs and symptoms indicating need for more acute intervention. Patient verbalized understanding. After Visit Summary given.         Rennis Harding, PA-C 03/26/20 1730

## 2020-03-26 NOTE — ED Triage Notes (Signed)
Pt presents with c/o  cough and nasal congestion.  

## 2020-03-28 LAB — SARS-COV-2, NAA 2 DAY TAT

## 2020-03-28 LAB — NOVEL CORONAVIRUS, NAA: SARS-CoV-2, NAA: NOT DETECTED

## 2020-08-05 ENCOUNTER — Ambulatory Visit (INDEPENDENT_AMBULATORY_CARE_PROVIDER_SITE_OTHER): Payer: PRIVATE HEALTH INSURANCE

## 2020-08-05 ENCOUNTER — Ambulatory Visit
Admission: EM | Admit: 2020-08-05 | Discharge: 2020-08-05 | Disposition: A | Discharge status: Home / Self Care | Payer: PRIVATE HEALTH INSURANCE | Attending: Family Medicine | Admitting: Family Medicine

## 2020-08-05 ENCOUNTER — Other Ambulatory Visit: Payer: Self-pay

## 2020-08-05 ENCOUNTER — Encounter: Payer: Self-pay | Admitting: Emergency Medicine

## 2020-08-05 DIAGNOSIS — M542 Cervicalgia: Secondary | ICD-10-CM

## 2020-08-05 DIAGNOSIS — M79631 Pain in right forearm: Secondary | ICD-10-CM | POA: Diagnosis not present

## 2020-08-05 DIAGNOSIS — M25511 Pain in right shoulder: Secondary | ICD-10-CM

## 2020-08-05 DIAGNOSIS — S161XXA Strain of muscle, fascia and tendon at neck level, initial encounter: Secondary | ICD-10-CM

## 2020-08-05 MED ORDER — CYCLOBENZAPRINE HCL 10 MG PO TABS: 10.0000 mg | ORAL_TABLET | Freq: Two times a day (BID) | ORAL | 0 refills | Status: AC | PRN

## 2020-08-05 MED ORDER — IBUPROFEN 600 MG PO TABS: 600.0000 mg | ORAL_TABLET | Freq: Four times a day (QID) | ORAL | 0 refills | Status: AC | PRN

## 2020-08-05 NOTE — Discharge Instructions (Signed)
X-ray was negative today for any fractures or dislocations.  I have sent in ibuprofen 800 mg for you to take 1 tablet every 8 hours as needed for pain and inflammation.  I have also sent in cyclobenzaprine for you to take 3 times a day as needed for muscle spasms.  This medication may make you sleepy.  Do not drive or operate heavy machinery with this medication.  Follow up with this office or with primary care if symptoms are persisting.  Follow up in the ER for high fever, trouble swallowing, trouble breathing, other concerning symptoms.
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
## 2020-08-08 ENCOUNTER — Ambulatory Visit
Admission: EM | Admit: 2020-08-08 | Discharge: 2020-08-08 | Disposition: A | Discharge status: Home / Self Care | Payer: PRIVATE HEALTH INSURANCE

## 2020-11-16 ENCOUNTER — Emergency Department (HOSPITAL_COMMUNITY)
Admission: EM | Admit: 2020-11-16 | Discharge: 2020-11-16 | Disposition: A | Discharge status: Home / Self Care | Payer: Self-pay | Attending: Emergency Medicine | Admitting: Emergency Medicine

## 2020-11-16 ENCOUNTER — Other Ambulatory Visit: Payer: Self-pay

## 2020-11-16 ENCOUNTER — Encounter (HOSPITAL_COMMUNITY): Payer: Self-pay | Admitting: *Deleted

## 2020-11-16 DIAGNOSIS — Z7722 Contact with and (suspected) exposure to environmental tobacco smoke (acute) (chronic): Secondary | ICD-10-CM | POA: Insufficient documentation

## 2020-11-16 DIAGNOSIS — X58XXXA Exposure to other specified factors, initial encounter: Secondary | ICD-10-CM | POA: Insufficient documentation

## 2020-11-16 DIAGNOSIS — S025XXA Fracture of tooth (traumatic), initial encounter for closed fracture: Secondary | ICD-10-CM | POA: Insufficient documentation

## 2020-11-16 DIAGNOSIS — K0889 Other specified disorders of teeth and supporting structures: Secondary | ICD-10-CM

## 2020-11-16 MED ORDER — AMOXICILLIN 500 MG PO CAPS
500.0000 mg | ORAL_CAPSULE | Freq: Three times a day (TID) | ORAL | 0 refills | Status: AC
Start: 2020-11-16 — End: ?

## 2020-11-16 NOTE — ED Triage Notes (Signed)
Pt with lower left dental pain x 2 months.

## 2020-11-16 NOTE — Discharge Instructions (Signed)
Please pick up antibiotics and take as prescribed to cover for an infection.  It is recommended that you take 800 mg Ibuprofen every 8 hours as needed for pain as well as 1,000 mg Tylenol (2 OTC double strength tablets) every 8 hours as needed for pain. You can alternate these medications every 4 hours to stay on top of the pain.   You will need to follow up with your dentist for further evaluation and ultimate extraction of this tooth. Attached is an additional Writer guide for dentists in the area.   Return to the ED for any worsening symptoms

## 2020-11-16 NOTE — ED Provider Notes (Signed)
[facility] EMERGENCY DEPARTMENT Provider Note   CSN: [id number] Arrival date & time: 11/16/20  1144     History Chief Complaint  Patient presents with  . Dental Pain    Philip Alexander is a 20 y.o. male who presents to the ED today with complaint of gradual onset, constant, achy, gradually worsening, left lower dental pain x 2 months, worsening over the past week. Pt reports that 2 months ago a portion of his tooth broke off and he has been having pain since then however last week he began having worsening pain. He complains of facial swelling and feeling like there is "infection" in his tooth. He denies any foul taste in his mouth or drainage from the tooth. He felt feverish last night but did not take his temperature. Pt does have a dentist however has not had the financial means to go and see him over the past 2 months. He has been taking 800 mg Ibuprofen with mild relief of the pain. No other complaints at this time.   The history is provided by the patient and medical records.       History reviewed. No pertinent past medical history.  There are no problems to display for this patient.   Past Surgical History:  Procedure Laterality Date  . LEG SURGERY Left        History reviewed. No pertinent family history.  Social History   Tobacco Use  . Smoking status: Passive Smoke Exposure - Never Smoker  . Smokeless tobacco: Former Engineer, water Use Topics  . Alcohol use: No  . Drug use: No    Home Medications Prior to Admission medications   Medication Sig Start Date End Date Taking? Authorizing Provider  amoxicillin (AMOXIL) 500 MG capsule Take 1 capsule (500 mg total) by mouth 3 (three) times daily. 11/16/20  Yes Malakye Nolden, PA-C  albuterol (VENTOLIN HFA) 108 (90 Base) MCG/ACT inhaler Inhale 2 puffs into the lungs every 4 (four) hours as needed for wheezing or shortness of breath. 01/17/20   Moshe Cipro, NP  benzonatate (TESSALON) 100 MG capsule Take 1  capsule (100 mg total) by mouth every 8 (eight) hours. 03/26/20   Wurst, [place], PA-C  cyclobenzaprine (FLEXERIL) 10 MG tablet Take 1 tablet (10 mg total) by mouth 2 (two) times daily as needed for muscle spasms. 08/05/20   Moshe Cipro, NP  dextromethorphan-guaiFENesin [facility] DM) 30-600 MG 12hr tablet Take 1 tablet by mouth 2 (two) times daily.    [provider]  ibuprofen (ADVIL) 600 MG tablet Take 1 tablet (600 mg total) by mouth every 6 (six) hours as needed. 08/05/20   Moshe Cipro, NP  levocetirizine (XYZAL) 5 MG tablet Take 1 tablet (5 mg total) by mouth every evening. Patient not taking: No sig reported 01/17/20   Moshe Cipro, NP  triamcinolone cream (KENALOG) 0.1 % Apply 1 application topically 2 (two) times daily. 02/25/20   Moshe Cipro, NP    Allergies    Codeine  Review of Systems   Review of Systems  Constitutional: Positive for fever (subjective).  HENT: Positive for dental problem and facial swelling. Negative for ear pain and trouble swallowing.   All other systems reviewed and are negative.   Physical Exam Updated Vital Signs BP 114/61 (BP Location: Left Arm)   Pulse 78   Temp 98.6 F (37 C) (Oral)   Resp 16   Ht 5\' 8"  (1.727 m)   Wt 76.2 kg   SpO2 100%  BMI 25.54 kg/m   Physical Exam Vitals and nursing note reviewed.  Constitutional:      Appearance: He is not ill-appearing.  HENT:     Head: Normocephalic and atraumatic.     Mouth/Throat:     Comments: Nose clear.  L upper tooth #17 decayed and fractured to the gumline with caries with TTP, with minimal surrounding gingival swelling and erythema, no definite abscess, no evidence of ludwig's.  Oropharynx clear and moist, without uvular swelling or deviation, no trismus or drooling, no tonsillar swelling or erythema, no exudates.   Eyes:     Conjunctiva/sclera: Conjunctivae normal.  Cardiovascular:     Rate and Rhythm: Normal rate and regular rhythm.     Pulses:  Normal pulses.     Heart sounds: No murmur heard.   Pulmonary:     Effort: Pulmonary effort is normal.     Breath sounds: Normal breath sounds. No wheezing, rhonchi or rales.  Skin:    General: Skin is warm and dry.     Coloration: Skin is not jaundiced.  Neurological:     Mental Status: He is alert.     ED Results / Procedures / Treatments   Labs (all labs ordered are listed, but only abnormal results are displayed) Labs Reviewed - No data to display  EKG None  Radiology No results found.  Procedures Procedures   Medications Ordered in ED Medications - No data to display  ED Course  I have reviewed the triage vital signs and the nursing notes.  Pertinent labs & imaging results that were available during my care of the patient were reviewed by me and considered in my medical decision making (see chart for details).    MDM Rules/Calculators/A&P                          20 year old male who presents to the ED today with complaints of left lower dental pain for the past 2 months, worsening over the past week.  History of fracture to this tooth.  Does have a dentist however has not had financial means to follow-up with him.  On arrival to the ED vitals are stable.  Patient is afebrile, nontachycardic and nontachypneic and appears to be in no acute distress.  He does complain of facial swelling however no obvious facial swelling appreciated on my exam.  No concern for Ludwig's angina at this time.  No trismus.  Patient does have fractured tooth #17 to the gumline with dental caries and gingival erythema.  There is no dental abscess to be drained at this time.  I do feel patient requires antibiotics and close follow-up with dentistry.  He is recommended to follow-up this week as he will need a tooth extracted.  Have instructed to continue 800 ibuprofen every 8 hours as well as an additional 1000 mg Tylenol every 8 hours as needed for pain.  Patient is in agreement with plan at this  time.  Was going to initially give him a one-time dose of a narcotic pain medication in the ED however he states he is allergic to codeine complains of anaphylaxis.  He states his pain is minimal at this time however he wanted to come to the ED t  be evaluated prior to it becoming severe again as it had last night.  Will discharge patient home at this time with antibiotics.  Instructed to start taking ibuprofen when he gets home and to alternate  ibuprofen and Tylenol as needed for pain.  Patient in agreement with plan at this time and stable for discharge.  This note was prepared using Dragon voice recognition software and may include unintentional dictation errors due to the inherent limitations of voice recognition software.  Final Clinical Impression(s) / ED Diagnoses Final diagnoses:  Pain, dental  Closed fracture of tooth, initial encounter    Rx / DC Orders ED Discharge Orders         Ordered    amoxicillin (AMOXIL) 500 MG capsule  3 times daily        11/16/20 1230           Discharge Instructions     Please pick up antibiotics and take as prescribed to cover for an infection.  It is recommended that you take 800 mg Ibuprofen every 8 hours as needed for pain as well as 1,000 mg Tylenol (2 OTC double strength tablets) every 8 hours as needed for pain. You can alternate these medications every 4 hours to stay on top of the pain.   You will need to follow up with your dentist for further evaluation and ultimate extraction of this tooth. Attached is an additional Writer guide for dentists in the area.   Return to the ED for any worsening symptoms       Tanda Rockers, PA-C 11/16/20 1231    Derwood Kaplan, MD 11/16/20 1424

## 2022-05-19 ENCOUNTER — Other Ambulatory Visit: Payer: Self-pay

## 2022-05-19 ENCOUNTER — Emergency Department: Payer: Self-pay

## 2022-05-19 DIAGNOSIS — R059 Cough, unspecified: Secondary | ICD-10-CM | POA: Insufficient documentation

## 2022-05-19 DIAGNOSIS — R197 Diarrhea, unspecified: Secondary | ICD-10-CM | POA: Insufficient documentation

## 2022-05-19 DIAGNOSIS — Z20822 Contact with and (suspected) exposure to covid-19: Secondary | ICD-10-CM | POA: Insufficient documentation

## 2022-05-19 DIAGNOSIS — Z5321 Procedure and treatment not carried out due to patient leaving prior to being seen by health care provider: Secondary | ICD-10-CM | POA: Insufficient documentation

## 2022-05-19 DIAGNOSIS — R112 Nausea with vomiting, unspecified: Secondary | ICD-10-CM | POA: Insufficient documentation

## 2022-05-19 DIAGNOSIS — R509 Fever, unspecified: Secondary | ICD-10-CM | POA: Insufficient documentation

## 2022-05-19 LAB — CBC WITH DIFFERENTIAL/PLATELET
Abs Immature Granulocytes: 0.02 10*3/uL (ref 0.00–0.07)
Basophils Absolute: 0 10*3/uL (ref 0.0–0.1)
Basophils Relative: 0 %
Eosinophils Absolute: 0 10*3/uL (ref 0.0–0.5)
Eosinophils Relative: 0 %
HCT: 49.1 % (ref 39.0–52.0)
Hemoglobin: 17.4 g/dL — ABNORMAL HIGH (ref 13.0–17.0)
Immature Granulocytes: 0 %
Lymphocytes Relative: 15 %
Lymphs Abs: 1.1 10*3/uL (ref 0.7–4.0)
MCH: 33.3 pg (ref 26.0–34.0)
MCHC: 35.4 g/dL (ref 30.0–36.0)
MCV: 94.1 fL (ref 80.0–100.0)
Monocytes Absolute: 0.6 10*3/uL (ref 0.1–1.0)
Monocytes Relative: 9 %
Neutro Abs: 5.5 10*3/uL (ref 1.7–7.7)
Neutrophils Relative %: 76 %
Platelets: 206 10*3/uL (ref 150–400)
RBC: 5.22 MIL/uL (ref 4.22–5.81)
RDW: 13 % (ref 11.5–15.5)
WBC: 7.2 10*3/uL (ref 4.0–10.5)
nRBC: 0 % (ref 0.0–0.2)

## 2022-05-19 LAB — URINALYSIS, ROUTINE W REFLEX MICROSCOPIC
Bacteria, UA: NONE SEEN
Bilirubin Urine: NEGATIVE
Glucose, UA: NEGATIVE mg/dL
Ketones, ur: NEGATIVE mg/dL
Leukocytes,Ua: NEGATIVE
Nitrite: NEGATIVE
Protein, ur: NEGATIVE mg/dL
Specific Gravity, Urine: 1.001 — ABNORMAL LOW (ref 1.005–1.030)
Squamous Epithelial / HPF: NONE SEEN (ref 0–5)
pH: 7 (ref 5.0–8.0)

## 2022-05-19 LAB — COMPREHENSIVE METABOLIC PANEL
ALT: 27 U/L (ref 0–44)
AST: 26 U/L (ref 15–41)
Albumin: 4.8 g/dL (ref 3.5–5.0)
Alkaline Phosphatase: 69 U/L (ref 38–126)
Anion gap: 11 (ref 5–15)
BUN: <5 mg/dL — ABNORMAL LOW (ref 6–20)
CO2: 24 mmol/L (ref 22–32)
Calcium: 9.5 mg/dL (ref 8.9–10.3)
Chloride: 102 mmol/L (ref 98–111)
Creatinine, Ser: 0.68 mg/dL (ref 0.61–1.24)
GFR, Estimated: >60 mL/min (ref >60)
Glucose, Bld: 143 mg/dL — ABNORMAL HIGH (ref 70–99)
Potassium: 2.8 mmol/L — ABNORMAL LOW (ref 3.5–5.1)
Sodium: 137 mmol/L (ref 135–145)
Total Bilirubin: 0.7 mg/dL (ref 0.3–1.2)
Total Protein: 8.3 g/dL — ABNORMAL HIGH (ref 6.5–8.1)

## 2022-05-19 LAB — RESP PANEL BY RT-PCR (FLU A&B, COVID) ARPGX2
Influenza A by PCR: NEGATIVE
Influenza B by PCR: NEGATIVE
SARS Coronavirus 2 by RT PCR: NEGATIVE

## 2022-05-19 LAB — TROPONIN I (HIGH SENSITIVITY): Troponin I (High Sensitivity): 4 ng/L (ref <18)

## 2022-05-19 LAB — LIPASE, BLOOD: Lipase: 28 U/L (ref 11–51)

## 2022-05-19 MED ORDER — POTASSIUM CHLORIDE 20 MEQ PO PACK: 40.0000 meq | PACK | Freq: Once | ORAL | Status: DC

## 2022-05-19 MED ORDER — ONDANSETRON HCL 4 MG/2ML IJ SOLN: 4.0000 mg | Freq: Once | INTRAMUSCULAR | Status: DC

## 2022-05-19 MED ORDER — SODIUM CHLORIDE 0.9 % IV BOLUS (SEPSIS): 1000.0000 mL | Freq: Once | INTRAVENOUS | Status: DC

## 2022-05-19 NOTE — ED Triage Notes (Signed)
Pt arrives with c/o n/v/d that started Sunday. Pt denies CP or SOB. Pt endorses fevers and cough also.

## 2022-05-20 ENCOUNTER — Emergency Department
Admission: EM | Admit: 2022-05-20 | Discharge: 2022-05-20 | Disposition: A | Discharge status: Home / Self Care | Payer: Self-pay | Attending: Emergency Medicine | Admitting: Emergency Medicine

## 2022-05-20 NOTE — ED Notes (Signed)
No answer when called several times from lobby 

## 2022-05-20 NOTE — ED Notes (Signed)
No answer when called several times from lobby; no answer when phone # listed in chart called 

## 2022-12-04 ENCOUNTER — Emergency Department (HOSPITAL_COMMUNITY): Payer: Medicaid Other

## 2022-12-04 ENCOUNTER — Other Ambulatory Visit: Payer: Self-pay

## 2022-12-04 ENCOUNTER — Emergency Department (HOSPITAL_COMMUNITY)
Admission: EM | Admit: 2022-12-04 | Discharge: 2022-12-04 | Disposition: A | Discharge status: Home / Self Care | Payer: Medicaid Other | Attending: Emergency Medicine | Admitting: Emergency Medicine

## 2022-12-04 ENCOUNTER — Encounter (HOSPITAL_COMMUNITY): Payer: Self-pay

## 2022-12-04 DIAGNOSIS — S0285XA Fracture of orbit, unspecified, initial encounter for closed fracture: Secondary | ICD-10-CM

## 2022-12-04 DIAGNOSIS — S02831A Fracture of medial orbital wall, right side, initial encounter for closed fracture: Secondary | ICD-10-CM | POA: Insufficient documentation

## 2022-12-04 DIAGNOSIS — S01111A Laceration without foreign body of right eyelid and periocular area, initial encounter: Secondary | ICD-10-CM | POA: Diagnosis not present

## 2022-12-04 DIAGNOSIS — S0181XA Laceration without foreign body of other part of head, initial encounter: Secondary | ICD-10-CM | POA: Diagnosis present

## 2022-12-04 MED ORDER — LIDOCAINE-EPINEPHRINE-TETRACAINE (LET) TOPICAL GEL
3.0000 mL | Freq: Once | TOPICAL | Status: AC
  Administered 2022-12-04: 3 mL via TOPICAL
  Filled 2022-12-04: qty 3

## 2022-12-04 MED ORDER — BACITRACIN ZINC 500 UNIT/GM EX OINT
TOPICAL_OINTMENT | Freq: Two times a day (BID) | CUTANEOUS | Status: DC
  Filled 2022-12-04: qty 0.9

## 2022-12-04 MED ORDER — LACTATED RINGERS IV BOLUS
1000.0000 mL | Freq: Once | INTRAVENOUS | Status: AC
  Administered 2022-12-04: 1000 mL via INTRAVENOUS

## 2022-12-04 MED ORDER — LIDOCAINE HCL (PF) 1 % IJ SOLN
INTRAMUSCULAR | Status: AC
  Filled 2022-12-04: qty 30

## 2022-12-04 MED ORDER — ACETAMINOPHEN 500 MG PO TABS
1000.0000 mg | ORAL_TABLET | Freq: Once | ORAL | Status: AC
  Administered 2022-12-04: 1000 mg via ORAL
  Filled 2022-12-04: qty 2

## 2022-12-04 MED ORDER — ONDANSETRON HCL 4 MG/2ML IJ SOLN
4.0000 mg | Freq: Once | INTRAMUSCULAR | Status: AC
  Administered 2022-12-04: 4 mg via INTRAVENOUS
  Filled 2022-12-04: qty 2

## 2022-12-04 NOTE — ED Notes (Signed)
Pt ambulatory to the BR with standby assistance.  
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
## 2022-12-04 NOTE — ED Notes (Signed)
Pt removed IV.
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
## 2022-12-06 ENCOUNTER — Encounter (HOSPITAL_COMMUNITY): Payer: Self-pay | Admitting: *Deleted
